# Patient Record
Sex: Male | Born: 2000 | Race: White | Hispanic: No | Marital: Single | Smoking: Never smoker
Health system: Southern US, Community
[De-identification: ages and names within clinical notes are randomized; demographics above are authoritative.]

---
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
## 2022-01-29 ENCOUNTER — Other Ambulatory Visit (HOSPITAL_COMMUNITY): Payer: Self-pay

## 2022-01-29 MED ORDER — LISDEXAMFETAMINE DIMESYLATE 30 MG PO CAPS
30.0000 mg | ORAL_CAPSULE | Freq: Every day | ORAL | 0 refills | Status: DC
  Filled 2022-01-29: qty 30, 30d supply, fill #0

## 2022-02-22 ENCOUNTER — Other Ambulatory Visit (HOSPITAL_COMMUNITY): Payer: Self-pay

## 2022-02-22 MED ORDER — LISDEXAMFETAMINE DIMESYLATE 30 MG PO CAPS
30.0000 mg | ORAL_CAPSULE | Freq: Every day | ORAL | 0 refills | Status: DC
  Filled 2022-02-22 – 2022-03-18 (×4): qty 30, 30d supply, fill #0

## 2022-03-05 ENCOUNTER — Other Ambulatory Visit (HOSPITAL_COMMUNITY): Payer: Self-pay

## 2022-03-08 ENCOUNTER — Other Ambulatory Visit (HOSPITAL_COMMUNITY): Payer: Self-pay

## 2022-03-14 ENCOUNTER — Other Ambulatory Visit (HOSPITAL_COMMUNITY): Payer: Self-pay

## 2022-03-18 ENCOUNTER — Other Ambulatory Visit (HOSPITAL_COMMUNITY): Payer: Self-pay

## 2022-04-22 ENCOUNTER — Other Ambulatory Visit (HOSPITAL_COMMUNITY): Payer: Self-pay

## 2022-04-22 MED ORDER — LISDEXAMFETAMINE DIMESYLATE 30 MG PO CAPS
30.0000 mg | ORAL_CAPSULE | Freq: Every day | ORAL | 0 refills | Status: DC
  Filled 2022-04-22: qty 30, 30d supply, fill #0

## 2022-05-24 ENCOUNTER — Other Ambulatory Visit: Payer: Self-pay

## 2022-05-24 ENCOUNTER — Other Ambulatory Visit (HOSPITAL_COMMUNITY): Payer: Self-pay

## 2022-05-24 MED ORDER — LISDEXAMFETAMINE DIMESYLATE 30 MG PO CAPS
30.0000 mg | ORAL_CAPSULE | Freq: Every day | ORAL | 0 refills | Status: DC
  Filled 2022-05-24: qty 30, 30d supply, fill #0

## 2022-05-27 ENCOUNTER — Other Ambulatory Visit: Payer: Self-pay

## 2022-06-25 ENCOUNTER — Other Ambulatory Visit (HOSPITAL_COMMUNITY): Payer: Self-pay

## 2022-06-25 MED ORDER — LISDEXAMFETAMINE DIMESYLATE 30 MG PO CAPS
30.0000 mg | ORAL_CAPSULE | Freq: Every day | ORAL | 0 refills | Status: DC
  Filled 2022-06-25: qty 30, 30d supply, fill #0

## 2022-08-08 ENCOUNTER — Other Ambulatory Visit (HOSPITAL_COMMUNITY): Payer: Self-pay

## 2022-08-08 MED ORDER — LISDEXAMFETAMINE DIMESYLATE 30 MG PO CAPS
30.0000 mg | ORAL_CAPSULE | Freq: Every day | ORAL | 0 refills | Status: DC
  Filled 2022-08-08: qty 30, 30d supply, fill #0

## 2022-08-09 ENCOUNTER — Other Ambulatory Visit (HOSPITAL_COMMUNITY): Payer: Self-pay

## 2022-09-12 ENCOUNTER — Other Ambulatory Visit (HOSPITAL_COMMUNITY): Payer: Self-pay

## 2022-09-12 MED ORDER — LISDEXAMFETAMINE DIMESYLATE 30 MG PO CAPS
30.0000 mg | ORAL_CAPSULE | Freq: Every day | ORAL | 0 refills | Status: DC
  Filled 2022-09-12: qty 30, 30d supply, fill #0

## 2022-10-23 ENCOUNTER — Other Ambulatory Visit (HOSPITAL_COMMUNITY): Payer: Self-pay

## 2022-10-23 MED ORDER — LISDEXAMFETAMINE DIMESYLATE 30 MG PO CAPS
30.0000 mg | ORAL_CAPSULE | Freq: Every day | ORAL | 0 refills | Status: DC
  Filled 2022-10-23 – 2022-10-25 (×2): qty 30, 30d supply, fill #0

## 2022-10-24 ENCOUNTER — Other Ambulatory Visit (HOSPITAL_COMMUNITY): Payer: Self-pay

## 2022-10-25 ENCOUNTER — Other Ambulatory Visit (HOSPITAL_COMMUNITY): Payer: Self-pay

## 2022-10-29 ENCOUNTER — Other Ambulatory Visit (HOSPITAL_COMMUNITY): Payer: Self-pay

## 2022-12-13 ENCOUNTER — Other Ambulatory Visit (HOSPITAL_COMMUNITY): Payer: Self-pay

## 2022-12-13 MED ORDER — LISDEXAMFETAMINE DIMESYLATE 30 MG PO CAPS
30.0000 mg | ORAL_CAPSULE | Freq: Every day | ORAL | 0 refills | Status: DC
  Filled 2022-12-13: qty 30, 30d supply, fill #0

## 2022-12-16 ENCOUNTER — Other Ambulatory Visit (HOSPITAL_COMMUNITY): Payer: Self-pay

## 2023-01-21 ENCOUNTER — Other Ambulatory Visit (HOSPITAL_COMMUNITY): Payer: Self-pay

## 2023-01-21 MED ORDER — LISDEXAMFETAMINE DIMESYLATE 30 MG PO CAPS
30.0000 mg | ORAL_CAPSULE | Freq: Every day | ORAL | 0 refills | Status: AC
  Filled 2023-01-21: qty 30, 30d supply, fill #0

## 2023-02-20 ENCOUNTER — Other Ambulatory Visit (HOSPITAL_COMMUNITY): Payer: Self-pay

## 2023-02-20 MED ORDER — LISDEXAMFETAMINE DIMESYLATE 40 MG PO CAPS
40.0000 mg | ORAL_CAPSULE | Freq: Every day | ORAL | 0 refills | Status: DC
  Filled 2023-02-20: qty 30, 30d supply, fill #0

## 2023-04-07 ENCOUNTER — Other Ambulatory Visit (HOSPITAL_COMMUNITY): Payer: Self-pay

## 2023-04-07 MED ORDER — LISDEXAMFETAMINE DIMESYLATE 40 MG PO CAPS
40.0000 mg | ORAL_CAPSULE | Freq: Every day | ORAL | 0 refills | Status: DC
  Filled 2023-04-07: qty 30, 30d supply, fill #0

## 2023-05-13 ENCOUNTER — Other Ambulatory Visit: Payer: Self-pay

## 2023-05-13 ENCOUNTER — Other Ambulatory Visit (HOSPITAL_COMMUNITY): Payer: Self-pay

## 2023-05-13 MED ORDER — LISDEXAMFETAMINE DIMESYLATE 40 MG PO CAPS
40.0000 mg | ORAL_CAPSULE | Freq: Every day | ORAL | 0 refills | Status: DC
Start: 2023-05-13 — End: 2023-07-31
  Filled 2023-05-13: qty 30, 30d supply, fill #0

## 2023-05-13 MED ORDER — FLUOXETINE HCL 10 MG PO CAPS
ORAL_CAPSULE | ORAL | 0 refills | Status: AC
  Filled 2023-05-13: qty 60, 37d supply, fill #0

## 2023-07-30 ENCOUNTER — Other Ambulatory Visit (HOSPITAL_COMMUNITY): Payer: Self-pay

## 2023-07-30 MED ORDER — FLUOXETINE HCL 20 MG PO CAPS
20.0000 mg | ORAL_CAPSULE | Freq: Every day | ORAL | 3 refills | Status: AC
  Filled 2023-07-30: qty 30, 30d supply, fill #0

## 2023-07-31 ENCOUNTER — Other Ambulatory Visit (HOSPITAL_COMMUNITY): Payer: Self-pay

## 2023-07-31 MED ORDER — LISDEXAMFETAMINE DIMESYLATE 40 MG PO CAPS
40.0000 mg | ORAL_CAPSULE | Freq: Every day | ORAL | 0 refills | Status: AC
  Filled 2023-07-31: qty 30, 30d supply, fill #0

## 2023-08-01 ENCOUNTER — Other Ambulatory Visit (HOSPITAL_COMMUNITY): Payer: Self-pay
